# Patient Record
Sex: Female | Born: 1975 | Race: Black or African American | Hispanic: No | Marital: Married | Smoking: Never smoker
Health system: Southern US, Community
[De-identification: ages and names within clinical notes are randomized; demographics above are authoritative.]

---

## 2000-02-07 ENCOUNTER — Emergency Department (HOSPITAL_COMMUNITY): Admission: EM | Admit: 2000-02-07 | Discharge: 2000-02-07 | Payer: Self-pay | Admitting: Emergency Medicine

## 2000-04-13 ENCOUNTER — Other Ambulatory Visit: Admission: RE | Admit: 2000-04-13 | Discharge: 2000-04-13 | Payer: Self-pay | Admitting: Gynecology

## 2000-07-29 ENCOUNTER — Encounter (INDEPENDENT_AMBULATORY_CARE_PROVIDER_SITE_OTHER): Payer: Self-pay | Admitting: *Deleted

## 2000-07-29 LAB — CONVERTED CEMR LAB

## 2000-08-19 ENCOUNTER — Encounter: Admission: RE | Admit: 2000-08-19 | Discharge: 2000-08-19 | Payer: Self-pay | Admitting: Family Medicine

## 2000-08-25 ENCOUNTER — Encounter: Admission: RE | Admit: 2000-08-25 | Discharge: 2000-08-25 | Payer: Self-pay | Admitting: Family Medicine

## 2000-09-03 ENCOUNTER — Ambulatory Visit (HOSPITAL_COMMUNITY): Admission: RE | Admit: 2000-09-03 | Discharge: 2000-09-03 | Payer: Self-pay | Admitting: Family Medicine

## 2000-09-14 ENCOUNTER — Encounter: Admission: RE | Admit: 2000-09-14 | Discharge: 2000-09-14 | Payer: Self-pay | Admitting: Family Medicine

## 2000-10-19 ENCOUNTER — Encounter: Admission: RE | Admit: 2000-10-19 | Discharge: 2000-10-19 | Payer: Self-pay | Admitting: Family Medicine

## 2000-11-04 ENCOUNTER — Encounter: Admission: RE | Admit: 2000-11-04 | Discharge: 2000-11-04 | Payer: Self-pay | Admitting: Family Medicine

## 2000-11-09 ENCOUNTER — Encounter: Admission: RE | Admit: 2000-11-09 | Discharge: 2000-11-09 | Payer: Self-pay | Admitting: Family Medicine

## 2000-11-12 ENCOUNTER — Inpatient Hospital Stay (HOSPITAL_COMMUNITY): Admission: RE | Admit: 2000-11-12 | Discharge: 2000-11-24 | Payer: Self-pay | Admitting: *Deleted

## 2000-11-23 ENCOUNTER — Encounter: Payer: Self-pay | Admitting: *Deleted

## 2000-12-01 ENCOUNTER — Encounter (HOSPITAL_COMMUNITY): Admission: AD | Admit: 2000-12-01 | Discharge: 2000-12-31 | Payer: Self-pay | Admitting: *Deleted

## 2000-12-17 ENCOUNTER — Encounter: Admission: RE | Admit: 2000-12-17 | Discharge: 2000-12-17 | Payer: Self-pay | Admitting: Obstetrics & Gynecology

## 2000-12-24 ENCOUNTER — Encounter: Admission: RE | Admit: 2000-12-24 | Discharge: 2000-12-24 | Payer: Self-pay | Admitting: Obstetrics

## 2001-01-07 ENCOUNTER — Encounter: Admission: RE | Admit: 2001-01-07 | Discharge: 2001-01-07 | Payer: Self-pay | Admitting: Obstetrics

## 2001-01-08 ENCOUNTER — Ambulatory Visit (HOSPITAL_COMMUNITY): Admission: RE | Admit: 2001-01-08 | Discharge: 2001-01-08 | Payer: Self-pay | Admitting: Obstetrics & Gynecology

## 2001-01-14 ENCOUNTER — Encounter: Admission: RE | Admit: 2001-01-14 | Discharge: 2001-01-14 | Payer: Self-pay | Admitting: Obstetrics

## 2001-01-15 ENCOUNTER — Inpatient Hospital Stay (HOSPITAL_COMMUNITY): Admission: AD | Admit: 2001-01-15 | Discharge: 2001-01-15 | Payer: Self-pay | Admitting: Obstetrics & Gynecology

## 2001-01-18 ENCOUNTER — Encounter (HOSPITAL_COMMUNITY): Admission: AD | Admit: 2001-01-18 | Discharge: 2001-02-17 | Payer: Self-pay | Admitting: *Deleted

## 2001-01-21 ENCOUNTER — Encounter: Admission: RE | Admit: 2001-01-21 | Discharge: 2001-01-21 | Payer: Self-pay | Admitting: Obstetrics

## 2001-01-28 ENCOUNTER — Encounter: Admission: RE | Admit: 2001-01-28 | Discharge: 2001-01-28 | Payer: Self-pay | Admitting: Obstetrics

## 2001-02-01 ENCOUNTER — Observation Stay (HOSPITAL_COMMUNITY): Admission: AD | Admit: 2001-02-01 | Discharge: 2001-02-02 | Payer: Self-pay | Admitting: Obstetrics

## 2001-02-03 ENCOUNTER — Inpatient Hospital Stay (HOSPITAL_COMMUNITY): Admission: AD | Admit: 2001-02-03 | Discharge: 2001-02-03 | Payer: Self-pay | Admitting: Obstetrics

## 2001-02-04 ENCOUNTER — Encounter: Admission: RE | Admit: 2001-02-04 | Discharge: 2001-02-04 | Payer: Self-pay | Admitting: Obstetrics

## 2001-02-06 ENCOUNTER — Inpatient Hospital Stay (HOSPITAL_COMMUNITY): Admission: AD | Admit: 2001-02-06 | Discharge: 2001-02-06 | Payer: Self-pay | Admitting: Obstetrics

## 2001-02-16 ENCOUNTER — Inpatient Hospital Stay (HOSPITAL_COMMUNITY): Admission: AD | Admit: 2001-02-16 | Discharge: 2001-02-19 | Payer: Self-pay | Admitting: Obstetrics

## 2001-02-17 ENCOUNTER — Encounter: Payer: Self-pay | Admitting: Obstetrics

## 2001-02-24 ENCOUNTER — Inpatient Hospital Stay (HOSPITAL_COMMUNITY): Admission: AD | Admit: 2001-02-24 | Discharge: 2001-02-24 | Payer: Self-pay | Admitting: Obstetrics

## 2001-02-24 ENCOUNTER — Encounter: Payer: Self-pay | Admitting: Obstetrics

## 2001-02-24 ENCOUNTER — Encounter: Admission: RE | Admit: 2001-02-24 | Discharge: 2001-02-24 | Payer: Self-pay | Admitting: Obstetrics & Gynecology

## 2001-03-01 ENCOUNTER — Encounter (HOSPITAL_COMMUNITY): Admission: RE | Admit: 2001-03-01 | Discharge: 2001-03-07 | Payer: Self-pay | Admitting: Obstetrics

## 2001-03-06 ENCOUNTER — Inpatient Hospital Stay (HOSPITAL_COMMUNITY): Admission: AD | Admit: 2001-03-06 | Discharge: 2001-03-10 | Payer: Self-pay | Admitting: *Deleted

## 2001-03-13 ENCOUNTER — Inpatient Hospital Stay (HOSPITAL_COMMUNITY): Admission: AD | Admit: 2001-03-13 | Discharge: 2001-03-13 | Payer: Self-pay | Admitting: Obstetrics & Gynecology

## 2001-05-13 ENCOUNTER — Encounter: Admission: RE | Admit: 2001-05-13 | Discharge: 2001-05-13 | Payer: Self-pay | Admitting: Family Medicine

## 2001-06-04 ENCOUNTER — Encounter: Admission: RE | Admit: 2001-06-04 | Discharge: 2001-06-04 | Payer: Self-pay | Admitting: Family Medicine

## 2001-06-11 ENCOUNTER — Encounter: Admission: RE | Admit: 2001-06-11 | Discharge: 2001-06-11 | Payer: Self-pay | Admitting: Family Medicine

## 2001-09-06 ENCOUNTER — Encounter: Admission: RE | Admit: 2001-09-06 | Discharge: 2001-09-06 | Payer: Self-pay | Admitting: Family Medicine

## 2002-07-27 ENCOUNTER — Encounter: Admission: RE | Admit: 2002-07-27 | Discharge: 2002-07-27 | Payer: Self-pay | Admitting: Family Medicine

## 2002-08-15 ENCOUNTER — Encounter: Admission: RE | Admit: 2002-08-15 | Discharge: 2002-08-15 | Payer: Self-pay | Admitting: Family Medicine

## 2002-09-02 ENCOUNTER — Encounter: Admission: RE | Admit: 2002-09-02 | Discharge: 2002-09-02 | Payer: Self-pay | Admitting: Family Medicine

## 2003-03-08 ENCOUNTER — Encounter: Admission: RE | Admit: 2003-03-08 | Discharge: 2003-03-08 | Payer: Self-pay | Admitting: Family Medicine

## 2003-07-17 ENCOUNTER — Emergency Department (HOSPITAL_COMMUNITY): Admission: EM | Admit: 2003-07-17 | Discharge: 2003-07-17 | Payer: Self-pay | Admitting: Emergency Medicine

## 2006-05-29 ENCOUNTER — Encounter (INDEPENDENT_AMBULATORY_CARE_PROVIDER_SITE_OTHER): Payer: Self-pay | Admitting: *Deleted

## 2016-01-03 ENCOUNTER — Emergency Department (HOSPITAL_COMMUNITY)
Admission: EM | Admit: 2016-01-03 | Discharge: 2016-01-03 | Disposition: A | Discharge status: Home / Self Care | Payer: Self-pay | Attending: Emergency Medicine | Admitting: Emergency Medicine

## 2016-01-03 ENCOUNTER — Encounter (HOSPITAL_COMMUNITY): Payer: Self-pay | Admitting: Emergency Medicine

## 2016-01-03 DIAGNOSIS — K047 Periapical abscess without sinus: Secondary | ICD-10-CM | POA: Insufficient documentation

## 2016-01-03 MED ORDER — HYDROCODONE-ACETAMINOPHEN 5-325 MG PO TABS
1.0000 | ORAL_TABLET | Freq: Once | ORAL | Status: AC
  Administered 2016-01-03: 1 via ORAL
  Filled 2016-01-03: qty 1

## 2016-01-03 MED ORDER — CHLORHEXIDINE GLUCONATE 0.12 % MT SOLN: 15.0000 mL | Freq: Two times a day (BID) | OROMUCOSAL | 0 refills | Status: AC

## 2016-01-03 MED ORDER — PENICILLIN V POTASSIUM 500 MG PO TABS: 500.0000 mg | ORAL_TABLET | Freq: Three times a day (TID) | ORAL | 0 refills | Status: AC

## 2016-01-03 MED ORDER — PENICILLIN V POTASSIUM 250 MG PO TABS
500.0000 mg | ORAL_TABLET | Freq: Once | ORAL | Status: AC
  Administered 2016-01-03: 500 mg via ORAL
  Filled 2016-01-03: qty 2

## 2016-01-03 MED ORDER — HYDROCODONE-ACETAMINOPHEN 5-325 MG PO TABS: 1.0000 | ORAL_TABLET | Freq: Four times a day (QID) | ORAL | 0 refills | Status: DC | PRN

## 2016-01-03 MED ORDER — LIDOCAINE VISCOUS 2 % MT SOLN
15.0000 mL | Freq: Once | OROMUCOSAL | Status: AC
  Administered 2016-01-03: 15 mL via OROMUCOSAL
  Filled 2016-01-03: qty 15

## 2016-01-03 NOTE — ED Triage Notes (Signed)
Patient with dental pain on the lower left jaw.  She states she has been taking ibuprofen for pain with no relief.

## 2016-01-03 NOTE — ED Provider Notes (Signed)
[facility]-EMERGENCY DEPT Provider Note   CSN: [id number] Arrival date & time: 01/03/16  0308     History   Chief Complaint Chief Complaint  Patient presents with  . Dental Pain    HPI Julia [name] is a 40 y.o. female.   Dental Pain    Patient presents with concern of mouth pain. The pain is along the anterior surface, just inferior to her central incisors. Pain began a few days ago, became worse with the past 24 hours. The pain is burning, sharp, nonradiating. No difficulty swallowing, speaking, breathing. She is notable history of prior dental bridge, with known open anterior surface of the roots of each of her 2 front teeth. No relief with OTC medication.   History reviewed. No pertinent past medical history.  There are no active problems to display for this patient.   History reviewed. No pertinent surgical history.  OB History    Gravida Para Term Preterm AB Living   1             SAB TAB Ectopic Multiple Live Births                   Home Medications    Prior to Admission medications   Not on File    Family History No family history on file.  Social History Social History  Substance Use Topics  . Smoking status: Never Smoker  . Smokeless tobacco: Never Used  . Alcohol use Not on file     Allergies   Review of patient's allergies indicates no known allergies.   Review of Systems Review of Systems  Constitutional: Negative for fever.  HENT:       History of present illness  Respiratory: Negative for shortness of breath.   Cardiovascular: Negative for chest pain.  Gastrointestinal: Negative for nausea and vomiting.  Musculoskeletal:       Negative aside from HPI  Skin:       Negative aside from HPI  Allergic/Immunologic: Negative for immunocompromised state.  Neurological: Negative for weakness.     Physical Exam Updated Vital Signs BP (!) 159/117 (BP Location: Left Arm)   Pulse 86   Temp 98.1 F (36.7 C) (Oral)   Resp 18    Ht 5\' 5"  (1.651 m)   Wt 245 lb (111.1 kg)   LMP 12/03/2015 (Approximate)   SpO2 100%   BMI 40.77 kg/m   Physical Exam  Constitutional: She is oriented to person, place, and time. She appears well-developed and well-nourished. No distress.  HENT:  Head: Normocephalic and atraumatic.  Mouth/Throat: Uvula is midline and oropharynx is clear and moist.    Eyes: Conjunctivae and EOM are normal.  Cardiovascular: Normal rate and regular rhythm.   Pulmonary/Chest: Effort normal and breath sounds normal. No stridor. No respiratory distress.  Abdominal: She exhibits no distension.  Musculoskeletal: She exhibits no edema.  Neurological: She is alert and oriented to person, place, and time. No cranial nerve deficit.  Skin: Skin is warm and dry.  Psychiatric: She has a normal mood and affect.  Nursing note and vitals reviewed.    ED Treatments / Results   Procedures Procedures (including critical care time)  Medications Ordered in ED Medications  lidocaine (XYLOCAINE) 2 % viscous mouth solution 15 mL (15 mLs Mouth/Throat Given 01/03/16 0524)  HYDROcodone-acetaminophen (NORCO/VICODIN) 5-325 MG per tablet 1 tablet (1 tablet Oral Given 01/03/16 0523)  penicillin v potassium (VEETID) tablet 500 mg (500 mg Oral Given 01/03/16 0523)  Initial Impression / Assessment and Plan / ED Course  I have reviewed the triage vital signs and the nursing notes.  Pertinent labs & imaging results that were available during my care of the patient were reviewed by me and considered in my medical decision making (see chart for details).  Clinical Course    After the initial evaluation the patient had analgesia, Vicodin, viscous lidocaine, penicillin provided. Line on repeat exam the patient notes that she has minimal improvement, but also no new complaints. We discussed the importance of following up with dental providers for next evaluation of her exposed dentition, as well as dental infection.   Final  Clinical Impressions(s) / ED Diagnoses  Patient presents with open dental area, early infection, but no evidence for bacteremia, sepsis, compromised respiratory status, nor deep space infection. Patient provided antibiotics, analgesia, referral to dental services.   Gerhard Munchobert Advait Buice, MD 01/03/16 [phone]

## 2016-01-03 NOTE — Discharge Instructions (Signed)
As discussed, it is very important that you monitor your condition carefully, and be sure to follow-up with our dentist for further evaluation and management.

## 2017-07-08 ENCOUNTER — Emergency Department (HOSPITAL_COMMUNITY)
Admission: EM | Admit: 2017-07-08 | Discharge: 2017-07-08 | Disposition: A | Discharge status: Home / Self Care | Payer: No Typology Code available for payment source | Attending: Emergency Medicine | Admitting: Emergency Medicine

## 2017-07-08 ENCOUNTER — Encounter (HOSPITAL_COMMUNITY): Payer: Self-pay

## 2017-07-08 ENCOUNTER — Emergency Department (HOSPITAL_COMMUNITY): Payer: No Typology Code available for payment source

## 2017-07-08 DIAGNOSIS — R030 Elevated blood-pressure reading, without diagnosis of hypertension: Secondary | ICD-10-CM

## 2017-07-08 DIAGNOSIS — E049 Nontoxic goiter, unspecified: Secondary | ICD-10-CM

## 2017-07-08 DIAGNOSIS — R51 Headache: Secondary | ICD-10-CM | POA: Diagnosis present

## 2017-07-08 DIAGNOSIS — D509 Iron deficiency anemia, unspecified: Secondary | ICD-10-CM

## 2017-07-08 DIAGNOSIS — R590 Localized enlarged lymph nodes: Secondary | ICD-10-CM

## 2017-07-08 DIAGNOSIS — R519 Headache, unspecified: Secondary | ICD-10-CM

## 2017-07-08 LAB — CBC WITH DIFFERENTIAL/PLATELET
BASOS ABS: 0.1 10*3/uL (ref 0.0–0.1)
BASOS PCT: 1 %
EOS ABS: 0.2 10*3/uL (ref 0.0–0.7)
EOS PCT: 4 %
HEMATOCRIT: 35.3 % — AB (ref 36.0–46.0)
HEMOGLOBIN: 11 g/dL — AB (ref 12.0–15.0)
LYMPHS ABS: 2 10*3/uL (ref 0.7–4.0)
LYMPHS PCT: 41 %
MCH: 21.9 pg — ABNORMAL LOW (ref 26.0–34.0)
MCHC: 31.2 g/dL (ref 30.0–36.0)
MCV: 70.2 fL — ABNORMAL LOW (ref 78.0–100.0)
MONO ABS: 0.3 10*3/uL (ref 0.1–1.0)
Monocytes Relative: 6 %
Neutro Abs: 2.4 10*3/uL (ref 1.7–7.7)
Neutrophils Relative %: 48 %
Platelets: 389 10*3/uL (ref 150–400)
RBC: 5.03 MIL/uL (ref 3.87–5.11)
RDW: 18.2 % — ABNORMAL HIGH (ref 11.5–15.5)
WBC: 4.9 10*3/uL (ref 4.0–10.5)

## 2017-07-08 LAB — BASIC METABOLIC PANEL
Anion gap: 9 (ref 5–15)
BUN: 8 mg/dL (ref 6–20)
CHLORIDE: 103 mmol/L (ref 101–111)
CO2: 25 mmol/L (ref 22–32)
Calcium: 8.5 mg/dL — ABNORMAL LOW (ref 8.9–10.3)
Creatinine, Ser: 0.53 mg/dL (ref 0.44–1.00)
GFR calc Af Amer: >60 mL/min (ref >60)
GFR calc non Af Amer: >60 mL/min (ref >60)
GLUCOSE: 102 mg/dL — AB (ref 65–99)
POTASSIUM: 3.4 mmol/L — AB (ref 3.5–5.1)
Sodium: 137 mmol/L (ref 135–145)

## 2017-07-08 LAB — POC URINE PREG, ED: Preg Test, Ur: NEGATIVE

## 2017-07-08 MED ORDER — SODIUM CHLORIDE 0.9 % IV BOLUS
1000.0000 mL | Freq: Once | INTRAVENOUS | Status: AC
  Administered 2017-07-08: 1000 mL via INTRAVENOUS

## 2017-07-08 MED ORDER — KETOROLAC TROMETHAMINE 30 MG/ML IJ SOLN
30.0000 mg | Freq: Once | INTRAMUSCULAR | Status: AC
  Administered 2017-07-08: 30 mg via INTRAVENOUS
  Filled 2017-07-08: qty 1

## 2017-07-08 MED ORDER — IOPAMIDOL (ISOVUE-370) INJECTION 76%
INTRAVENOUS | Status: AC
  Filled 2017-07-08: qty 100

## 2017-07-08 MED ORDER — IOPAMIDOL (ISOVUE-370) INJECTION 76%
100.0000 mL | Freq: Once | INTRAVENOUS | Status: AC | PRN
  Administered 2017-07-08: 100 mL via INTRAVENOUS

## 2017-07-08 MED ORDER — ACETAMINOPHEN 500 MG PO TABS
1000.0000 mg | ORAL_TABLET | Freq: Once | ORAL | Status: AC
  Administered 2017-07-08: 1000 mg via ORAL
  Filled 2017-07-08: qty 2

## 2017-07-08 MED ORDER — SODIUM CHLORIDE 0.9 % IV BOLUS
500.0000 mL | Freq: Once | INTRAVENOUS | Status: AC
  Administered 2017-07-08: 500 mL via INTRAVENOUS

## 2017-07-08 MED ORDER — SUMATRIPTAN SUCCINATE 50 MG PO TABS: 50.0000 mg | ORAL_TABLET | ORAL | 0 refills | Status: DC | PRN

## 2017-07-08 MED ORDER — METOCLOPRAMIDE HCL 5 MG/ML IJ SOLN
10.0000 mg | Freq: Once | INTRAMUSCULAR | Status: AC
  Administered 2017-07-08: 10 mg via INTRAVENOUS
  Filled 2017-07-08: qty 2

## 2017-07-08 NOTE — ED Notes (Signed)
Pt complains of a severe headache and high blood pressure for two days, no history of either problem

## 2017-07-08 NOTE — ED Notes (Signed)
Attempted to start IV x 2 unsuccessfully

## 2017-07-08 NOTE — ED Notes (Signed)
Patient transported to MRI 

## 2017-07-08 NOTE — ED Provider Notes (Signed)
[facility]-EMERGENCY DEPT Provider Note   CSN: [id number] Arrival date & time: 07/08/17  0259     History   Chief Complaint Chief Complaint  Patient presents with  . Headache    HPI Julia [name] is a 42 y.o. female.   42 year old female presents to the emergency department for evaluation of a headache which began yesterday morning.  It has been constant and waxing and waning in severity.  She notes the pain primarily above her left eye.  She has been taking Tylenol for symptoms without relief.  She does note a history of similar headaches, but reports they have never been "this bad".  No associated fevers or recent head injury or trauma.  She further denies vision loss, nausea, vomiting, extremity numbness or weakness.  Patient noticed that her blood pressure was higher than baseline prior to arrival.  No history of hypertension.     History reviewed. No pertinent past medical history.  There are no active problems to display for this patient.   History reviewed. No pertinent surgical history.   OB History    Gravida  1   Para      Term      Preterm      AB      Living        SAB      TAB      Ectopic      Multiple      Live Births               Home Medications    Prior to Admission medications   Medication Sig Start Date End Date Taking? Authorizing Provider  acetaminophen (TYLENOL) 500 MG tablet Take 1,000 mg by mouth every 6 (six) hours as needed for moderate pain or headache.   Yes [provider]  HYDROcodone-acetaminophen (NORCO/VICODIN) 5-325 MG tablet Take 1 tablet by mouth every 6 (six) hours as needed for severe pain. Patient not taking: Reported on 07/08/2017 01/03/16   Gerhard MunchLockwood, Robert, MD    Family History History reviewed. No pertinent family history.  Social History Social History   Tobacco Use  . Smoking status: Never Smoker  . Smokeless tobacco: Never Used  Substance Use Topics  .  Alcohol use: Never    Frequency: Never  . Drug use: Never     Allergies   Patient has no known allergies.   Review of Systems Review of Systems Ten systems reviewed and are negative for acute change, except as noted in the HPI.    Physical Exam Updated Vital Signs BP 121/83 (BP Location: Left Arm)   Pulse 74   Temp 98 F (36.7 C) (Oral)   Resp 20   LMP 07/05/2017   SpO2 100%   Breastfeeding? Unknown   Physical Exam  Constitutional: She is oriented to person, place, and time. She appears well-developed and well-nourished. No distress.  Nontoxic appearing and in NAD  HENT:  Head: Normocephalic and atraumatic.  Eyes: Conjunctivae and EOM are normal. No scleral icterus.  Neck: Normal range of motion.  No nuchal rigidity or meningismus  Pulmonary/Chest: Effort normal. No respiratory distress.  Respirations even and unlabored  Musculoskeletal: Normal range of motion.  Neurological: She is alert and oriented to person, place, and time. No cranial nerve deficit. She exhibits normal muscle tone. Coordination normal.  GCS 15. Speech is goal oriented. No cranial nerve deficits appreciated; symmetric eyebrow raise, no facial drooping, tongue midline. Sensation to light touch intact.  Patient moves extremities without ataxia. No focal deficits noted on exam.  Skin: Skin is warm and dry. No rash noted. She is not diaphoretic. No erythema. No pallor.  Psychiatric: She has a normal mood and affect. Her behavior is normal.  Nursing note and vitals reviewed.    ED Treatments / Results  Labs (all labs ordered are listed, but only abnormal results are displayed) Labs Reviewed - No data to display  EKG None  Radiology No results found.  Procedures Procedures (including critical care time)  Medications Ordered in ED Medications  ketorolac (TORADOL) 30 MG/ML injection 30 mg (has no administration in time range)  metoCLOPramide (REGLAN) injection 10 mg (has no administration in  time range)  sodium chloride 0.9 % bolus 500 mL (has no administration in time range)     Initial Impression / Assessment and Plan / ED Course  I have reviewed the triage vital signs and the nursing notes.  Pertinent labs & imaging results that were available during my care of the patient were reviewed by me and considered in my medical decision making (see chart for details).     Patient presents to the emergency department for evaluation of headache which began yesterday AM.  Patient with no history of recent head injury or trauma.  No fever, nuchal rigidity, meningismus to suggest meningitis.  Neurologic exam today is nonfocal.  No concerning findings to suggest [facility] or other emergent intracranial process.  Plan for headache management with Toradol, Reglan, fluids.  Patient signed out to Aviva Kluver, PA-C at change of shift will assess patient and disposition appropriately.  Vitals:   07/08/17 0332 07/08/17 0431  BP: (!) 147/115 121/83  Pulse: 74   Resp: 20   Temp: 98 F (36.7 C)   TempSrc: Oral   SpO2: 100%     Final Clinical Impressions(s) / ED Diagnoses   Final diagnoses:  Bad headache    ED Discharge Orders    None       Antony Madura, PA-C 07/08/17 0631    Molpus, Jonny Ruiz, MD 07/08/17 [phone]

## 2017-07-08 NOTE — ED Provider Notes (Signed)
Physical Exam  BP 129/75 (BP Location: Left Arm)   Pulse 70   Temp 98 F (36.7 C) (Oral)   Resp 18   LMP 07/05/2017   SpO2 98%   Breastfeeding? Unknown   [facility]. Briefly, the patient is a 42 y.o. female with PMHx of  has no past medical history on file. here with left-sided headache.  Patient reports that her headaches are usually frontal in nature, and less severe.  Patient reports that this current episode is "throbbing".  Patient denies neck stiffness or pain, fevers, chills, nausea, vomiting, or recent illness.  Patient denies visual disturbance with her headache.  Patient reports that for half hour.  Yesterday, she had her left hand weakness with grip strength, that resolved spontaneously.  No focal deficit at present.  No rashes.  Patient reports that sitting up makes her headache worse, and lying flat resolves it. Not relieved by OTC analgesia.  No family history of aneurysms, subarachnoid bleeding, stroke.  Patient denies estrogen use.  Labs Reviewed  CBC WITH DIFFERENTIAL/PLATELET - Abnormal; Notable for the following components:      Result Value   Hemoglobin 11.0 (*)    HCT 35.3 (*)    MCV 70.2 (*)    MCH 21.9 (*)    RDW 18.2 (*)    All other components within normal limits  BASIC METABOLIC PANEL  POC URINE PREG, ED    Course of Care:   Physical Exam  Constitutional: She appears well-developed and well-nourished. No distress.  HENT:  Head: Normocephalic and atraumatic.  Mouth/Throat: Oropharynx is clear and moist.  Eyes: Pupils are equal, round, and reactive to light. Conjunctivae and EOM are normal.  Neck: Normal range of motion. Neck supple. No neck rigidity. No Brudzinski's sign noted.  Cardiovascular: Normal rate, regular rhythm, S1 normal and S2 normal.  No murmur heard. Pulmonary/Chest: Effort normal and breath sounds normal. She has no wheezes. She has no rales.  Abdominal: Soft. She exhibits no distension. There is no tenderness.  There is no guarding.  Musculoskeletal: Normal range of motion. She exhibits no edema or deformity.  Lymphadenopathy:    She has no cervical adenopathy.  Neurological: She is alert. GCS eye subscore is 4. GCS verbal subscore is 5. GCS motor subscore is 6.  Mental Status:  Alert, oriented, thought content appropriate, able to give a coherent history. Speech fluent without evidence of aphasia. Able to follow 2 step commands without difficulty.  Cranial Nerves:  II:  Peripheral visual fields grossly normal, pupils equal, round, reactive to light III,IV, VI: ptosis not present, extra-ocular motions intact bilaterally  V,VII: smile symmetric, facial light touch sensation equal VIII: hearing grossly normal to voice  X: uvula elevates symmetrically  XI: bilateral shoulder shrug symmetric and strong XII: midline tongue extension without fassiculations Motor:  Normal tone. 5/5 in upper and lower extremities bilaterally including strong and equal grip strength and dorsiflexion/plantar flexion Sensory: Light touch normal in all extremities and equal.  Deep Tendon Reflexes: 2+ and symmetric in the biceps and patella. No clonus. Cerebellar: normal finger-to-nose with bilateral upper extremities Gait: normal gait and balance Stance: No pronator drift and good coordination, strength, and position sense with tapping of bilateral arms (performed in sitting position). CV pulses palpable throughout.   Skin: Skin is warm and dry. No rash noted. No erythema.  Psychiatric: She has a normal mood and affect. Her behavior is normal. Judgment and thought content normal.  Nursing note and vitals  reviewed.   ED Course/Procedures   Clinical Course as of Jul 08 1349  Wed Jul 08, 2017  1319 Patient reassessed. Patient continuing to have positional headache.    [AM]  1344 Patient tolerating PO and does have improved headache.    [AM]    Clinical Course User Index [AM] Elisha Ponder, PA-C     Procedures  MDM  Patient well-appearing in no acute distress.  Patient asymptomatic when lying down, but reports that her headache begins to return when she ambulates.  Differential diagnosis includes TIA, complicated migraine, venous thrombosis.  Doubt subarachnoid hemorrhage, due to the gradual onset of the pain.  Doubt meningitis, as patient is afebrile, has normal mental status and normal neurologic exam, and is without nuchal rigidity or meningismal signs.  Due to the transient neurologic symptoms patient reported of left hand weakness, will obtain MRI-MRA to assess for vascular anomalies and infarction.  Lab work without significant abnormality.  Patient was unable to tolerate the modifications required to be in MRI machine.  Patient also declined antianxiety medications.  Proceed to CT head noncontrast as well as CTA head and neck.  CT angiogram of the head and neck demonstrates no acute vascular findings.  CT scan of the head and neck demonstrates a nonspecific goiter as well as nonspecific cervical adenopathy.  Patient to follow-up with ear nose and throat.,   Patient is ambulatory, tolerating p.o., and has improved headache upon discharge.  Patient independently evaluated by Dr. Chaney Malling who agrees with no further workup at this time.  Given the normal parenchyma of the CT, doubt sinus venous thrombosis.      Elisha Ponder, PA-C 07/08/17 1400    Charlynne Pander, MD 07/08/17 [phone]

## 2017-07-08 NOTE — ED Notes (Signed)
Spoke with MRI. MRI will be here to get patient in about 45 min. Verified with patient that she is able to lay flat and is not claustrophobic.

## 2017-07-08 NOTE — Discharge Instructions (Addendum)
Please see the information and instructions below regarding your visit.  Your diagnoses today include:  1. Bad headache   2. Left-sided headache   3. Microcytic anemia   4. Elevated blood pressure reading without diagnosis of hypertension   5. Cervical adenopathy   6. Goiter     You were seen and treated in the emergency department today for headache. Fortunately, your vitals, exam, and work-up is reassuring with no apparent emergent cause for your headache at this time.  Tests performed today include: See side panel of your discharge paperwork for testing performed today. Vital signs are listed at the bottom of these instructions.   Please follow the prescribed instructions for Imitrex.  Please only take once a week.  Your limited to 200 mg in 1 day for this medication.  Medications prescribed:    Try to avoid daily or regular use of tylenol, aspirin, ibuprofen, and other overt-the-counter pain medications as this can contribute to rebound headaches.   Take any prescribed medications only as prescribed, and any over the counter medications only as directed on the packaging.  Home care instructions:   Drink plenty of fluids at home. This will help with your headache. Be cautious with caffeine use, as this can cause your headache to rebound when the effects wear off. If you drink more than 2 cups of coffee/caffeinated tea, or caffeinated soda per day, I suggest you wean down that amount.  Please follow any educational materials contained in this packet.   Follow-up instructions: Please follow-up with your primary care provider in  for further evaluation of your symptoms if they are not completely improved.   Please follow up ENT for the enlarged thyroid seen on your Ct.   Please follow-up with [facility] for your headaches.   Return instructions:  Please return to the Emergency Department if you experience worsening symptoms. It is VERY important that you  monitor your symptoms at home. If you develop worsening headache, new fever, new neck stiffness, rash, focal weakness or numbness, or any other new or concerning symptoms, please return to the ED immediately, as these may be signs that your headache has become a potentially serious and life-threatening condition.  Please return if you have any other emergent concerns.  Additional Information:   Your vital signs today were: BP 137/88 (BP Location: Left Arm)    Pulse 66    Temp 98 F (36.7 C) (Oral)    Resp 18    LMP 07/05/2017    SpO2 97%    Breastfeeding? Unknown Comment: negative urine pregnancy test 07-08-2017 If your blood pressure (BP) was elevated on multiple readings during this visit above 130 for the top number or above 80 for the bottom number, please have this repeated by your primary care provider within one month. --------------  Thank you for allowing us to participate in your care today.

## 2018-05-21 ENCOUNTER — Ambulatory Visit: Payer: Self-pay

## 2018-05-21 ENCOUNTER — Ambulatory Visit: Payer: No Typology Code available for payment source | Admitting: Sports Medicine

## 2018-05-21 ENCOUNTER — Encounter: Payer: Self-pay | Admitting: Sports Medicine

## 2018-05-21 VITALS — BP 110/76 | HR 89 | Ht 65.0 in | Wt 252.6 lb

## 2018-05-21 DIAGNOSIS — M25561 Pain in right knee: Secondary | ICD-10-CM

## 2018-05-21 DIAGNOSIS — M25562 Pain in left knee: Secondary | ICD-10-CM | POA: Diagnosis not present

## 2018-05-21 DIAGNOSIS — M25462 Effusion, left knee: Secondary | ICD-10-CM | POA: Diagnosis not present

## 2018-05-21 DIAGNOSIS — M170 Bilateral primary osteoarthritis of knee: Secondary | ICD-10-CM | POA: Diagnosis not present

## 2018-05-21 DIAGNOSIS — G8929 Other chronic pain: Secondary | ICD-10-CM

## 2018-05-21 NOTE — Progress Notes (Signed)
PROCEDURE NOTE:  Ultrasound Guided: Injection: Right knee Images were obtained and interpreted by myself, Gaspar Bidding, DO  Images have been saved and stored to PACS system. Images obtained on: GE S7 Ultrasound machine    ULTRASOUND FINDINGS:  No significant effusion, moderate synovitis  DESCRIPTION OF PROCEDURE:  The patient's clinical condition is marked by substantial pain and/or significant functional disability. Other conservative therapy has not provided relief, is contraindicated, or not appropriate. There is a reasonable likelihood that injection will significantly improve the patient's pain and/or functional impairment.   After discussing the risks, benefits and expected outcomes of the injection and all questions were reviewed and answered, the patient wished to undergo the above named procedure.  Verbal consent was obtained.  The ultrasound was used to identify the target structure and adjacent neurovascular structures. The skin was then prepped in sterile fashion and the target structure was injected under direct visualization using sterile technique as below:  Single injection performed as below: PREP: Alcohol and Ethel Chloride APPROACH:superiolateral, single injection, 25g 1.5 in. INJECTATE: 2 cc 0.5% Marcaine and 1 cc 40mg /mL DepoMedrol ASPIRATE: None DRESSING: Band-Aid  Post procedural instructions including recommending icing and warning signs for infection were reviewed.    This procedure was well tolerated and there were no complications.   IMPRESSION: Succesful Ultrasound Guided: Injection

## 2018-05-21 NOTE — Procedures (Signed)
PROCEDURE NOTE:  Ultrasound Guided: Aspiration and Injection: Left knee Images were obtained and interpreted by myself, Gaspar Bidding, DO  Images have been saved and stored to PACS system. Images obtained on: GE S7 Ultrasound machine    ULTRASOUND FINDINGS:  Moderate effusion  DESCRIPTION OF PROCEDURE:  The patient's clinical condition is marked by substantial pain and/or significant functional disability. Other conservative therapy has not provided relief, is contraindicated, or not appropriate. There is a reasonable likelihood that injection will significantly improve the patient's pain and/or functional impairment.   After discussing the risks, benefits and expected outcomes of the injection and all questions were reviewed and answered, the patient wished to undergo the above named procedure.  Verbal consent was obtained.  The ultrasound was used to identify the target structure and adjacent neurovascular structures. The skin was then prepped in sterile fashion and the target structure was injected under direct visualization using sterile technique as below:  Single injection performed as below: PREP: Alcohol, Ethel Chloride and 5 cc 1% lidocaine on 25g 1.5 in. needle APPROACH:superiolateral, stopcock technique, 18g 1.5 in. INJECTATE: 2 cc 0.5% Marcaine and 1 cc 40mg /mL DepoMedrol ASPIRATE: 20 cc of slightly orange-tinged synovial fluid DRESSING: Band-Aid  Post procedural instructions including recommending icing and warning signs for infection were reviewed.    This procedure was well tolerated and there were no complications.   IMPRESSION: Succesful Ultrasound Guided: Aspiration and Injection

## 2018-05-21 NOTE — Patient Instructions (Signed)

## 2018-05-21 NOTE — Progress Notes (Signed)
Julia [name]. Julia [name] Sports Medicine [facility] at [facility] [phone]  [name] - 43 y.o. female MRN [id number]  Date of birth: [birth year]  Visit Date: May 21, 2018  PCP: Patient, No Pcp Per   Referred by: No ref. provider found  SUBJECTIVE:   Chief Complaint  Patient presents with  . New Patient (Initial Visit)    B knee pain    HPI: Patient presents with worsening bilateral knee pain.  It has been progressive over the past year to the point that now she is having generalized tightness and severe pain across the entire left knee.  She has mild pain on the right.  She feels there is knee is tight.  It is worse when going from sit to stand.  Initially has difficulty with weightbearing.  Positive theater sign.  She is tried naproxen as needed and ibuprofen and this previously was helpful but no longer is.  REVIEW OF SYSTEMS: Denies fevers, chills, recent weight gain or weight loss.  No night sweats.  Pt denies any change in bowel or bladder habits, muscle weakness, numbness or falls associated with this pain. She does have some nighttime disturbances due to this Otherwise 12 point review of systems performed and is negative   HISTORY:  Prior history reviewed and updated per electronic medical record.  There are no active problems to display for this patient.  Social History   Occupational History  . Not on file  Tobacco Use  . Smoking status: Never Smoker  . Smokeless tobacco: Never Used  Substance and Sexual Activity  . Alcohol use: Never    Frequency: Never  . Drug use: Never  . Sexual activity: Not on file   Social History   Social History Narrative  . Not on file   No past medical history on file. No past surgical history on file. family history is not on file.  OBJECTIVE:  VS:  HT:5\' 5"  (165.1 cm)   WT:252 lb 9.6 oz (114.6 kg)  BMI:42.03    BP:110/76  HR:89bpm  TEMP: ( )  RESP:96 %   PHYSICAL  EXAM: CONSTITUTIONAL: Well-developed, Well-nourished and In no acute distress EYES: Pupils are equal., EOM intact without nystagmus. and No scleral icterus. Psychiatric: Alert & appropriately interactive. and Not depressed or anxious appearing. EXTREMITY EXAM: Warm and well perfused  Her bilateral knees are quite large.  She does have an effusion on the left and generalized tenderness diffusely on both knees.  Ligamentously she is stable to varus and valgus strain.  Negative McMurray's.  Slightly positive patellar grind on the left, mild on the right.  Extensor mechanism intact.  No significant lower extremity edema.   ASSESSMENT:   1. Chronic pain of both knees   2. Effusion of left knee   3. Primary osteoarthritis of both knees     PROCEDURES:  US Guided Injection per procedure note      PLAN:  Pertinent additional documentation may be included in corresponding procedure notes, imaging studies, problem based documentation and patient instructions.  No problem-specific Assessment & Plan notes found for this encounter.  We will go ahead and inject the knee per procedure note    Icing discussed PRN.  If persistent ongoing symptoms can consider repeat injections and viscous supplementation.  Activity modifications and the importance of avoiding exacerbating activities (limiting pain to no more than a 4 / 10 during or following activity) recommended and discussed.  Discussed red flag symptoms  that warrant earlier emergent evaluation and patient voices understanding.  No orders of the defined types were placed in this encounter.  Lab Orders  No laboratory test(s) ordered today   Imaging Orders     [place] MSK POCT ULTRASOUND Referral Orders  No referral(s) requested today    Return in about 6 weeks (around 07/02/2018) for bilateral knee pain.          Andrena Mews, DO    [facility] Physician

## 2018-06-24 ENCOUNTER — Ambulatory Visit (INDEPENDENT_AMBULATORY_CARE_PROVIDER_SITE_OTHER): Payer: No Typology Code available for payment source | Admitting: Sports Medicine

## 2018-06-24 ENCOUNTER — Encounter: Payer: Self-pay | Admitting: Sports Medicine

## 2018-06-24 ENCOUNTER — Ambulatory Visit (INDEPENDENT_AMBULATORY_CARE_PROVIDER_SITE_OTHER): Payer: No Typology Code available for payment source

## 2018-06-24 ENCOUNTER — Other Ambulatory Visit: Payer: Self-pay

## 2018-06-24 VITALS — HR 88 | Temp 98.3°F | Ht 65.0 in | Wt 242.2 lb

## 2018-06-24 DIAGNOSIS — M25562 Pain in left knee: Secondary | ICD-10-CM

## 2018-06-24 DIAGNOSIS — M25462 Effusion, left knee: Secondary | ICD-10-CM

## 2018-06-24 DIAGNOSIS — M25561 Pain in right knee: Secondary | ICD-10-CM | POA: Diagnosis not present

## 2018-06-24 DIAGNOSIS — M170 Bilateral primary osteoarthritis of knee: Secondary | ICD-10-CM

## 2018-06-24 DIAGNOSIS — G8929 Other chronic pain: Secondary | ICD-10-CM | POA: Diagnosis not present

## 2018-06-24 NOTE — Progress Notes (Signed)
Julia [name]. [name] Sports Medicine [facility] at [facility] [phone]  Julia [name] - 43 y.o. female MRN [id number]  Date of birth: 08-Jan-1976  Visit Date: June 27, 2018  PCP: Patient, No Pcp Per   Referred by: No ref. provider found  SUBJECTIVE:  Chief Complaint  Patient presents with  . Follow-up    B knee pain.  Worsened w/ sit to stand.  B knee injections - 05/21/18    HPI: Patient is here for follow-up of bilateral knee pain.  Left is worse than the right.  Continues to be mild.  Pain is worse with going from sit to stand as well as going up and down steps.  She has a positive theater sign.  She underwent bilateral injections on 2 1 with aspiration of the left knee.  She reports overall they seem to help some but only incomplete relief of pain.  She continues to take ibuprofen intermittently.  REVIEW OF SYSTEMS: She denies any significant nighttime disturbances.  Denies any fevers chills or night sweats.  Otherwise 12 point review of systems is performed and negative.  HISTORY:  Prior history reviewed and updated per electronic medical record.  There are no active problems to display for this patient.  Social History   Occupational History  . Not on file  Tobacco Use  . Smoking status: Never Smoker  . Smokeless tobacco: Never Used  Substance and Sexual Activity  . Alcohol use: Never    Frequency: Never  . Drug use: Never  . Sexual activity: Not on file   Social History   Social History Narrative  . Not on file     OBJECTIVE:  VS:  HT:5\' 5"  (165.1 cm)   WT:242 lb 3.2 oz (109.9 kg)  BMI:40.3    BP:   HR:88bpm  TEMP:98.3 F (36.8 C)( )  RESP:99 %   PHYSICAL EXAM: Adult female. No acute distress.  Alert and appropriate. Bilateral knees are well aligned.  She has improved effusion bilaterally.  Only small effusion today on the left.  Ligamentously she has 2 2 mm of opening with valgus stress bilaterally.  Pain along the  medial greater than lateral joint lines.  Bilateral knee x-rays obtained today that do show mild to moderate degenerative changes worse in the medial compartment bilaterally.  She does have some patellofemoral wear.   ASSESSMENT:  1. Chronic pain of both knees     PROCEDURES:  PROCEDURE NOTE: THERAPEUTIC EXERCISES (97110) 15 minutes spent for Therapeutic exercises as below and as referenced in the AVS.  This included exercises focusing on stretching, strengthening, with significant focus on eccentric aspects.   Proper technique shown and discussed handout in great detail with ATC.  All questions were discussed and answered.   Long term goals include an improvement in range of motion, strength, endurance as well as avoiding reinjury. Frequency of visits is one time as determined during today's  office visit. Frequency of exercises to be performed is as per handout.  EXERCISES REVIEWED: Hip ABduction strengthening with focus on Glute Medius Recruitment VMO Strengthening   PLAN:  Pertinent additional documentation may be included in corresponding procedure notes, imaging studies, problem based documentation and patient instructions.  No problem-specific Assessment & Plan notes found for this encounter.   She had incomplete relief with the last corticosteroid injection.Open have her try the topical diclofenac that she got has and get her preapproved for Zilretta injections bilaterally given the overall moderate but  incomplete relief of improvement.   Activity modifications and the importance of avoiding exacerbating activities (limiting pain to no more than a 4 / 10 during or following activity) recommended and discussed.   Discussed red flag symptoms that warrant earlier emergent evaluation and patient voices understanding.    No orders of the defined types were placed in this encounter.  Lab Orders  No laboratory test(s) ordered today    Imaging Orders     DG Knee 3 Views Left      DG Knee 1-2 Views Right Referral Orders  No referral(s) requested today    Return in about 4 weeks (around 07/22/2018), or if symptoms worsen or fail to improve.          Julia Mews, DO    [facility] Physician

## 2018-06-24 NOTE — Patient Instructions (Addendum)
Pennsaid instructions: You have been given a sample/prescription for Pennsaid, a topical medication.     You are to apply this gel to your injured body part twice daily (morning and evening).   A little goes a long way so you can use about a pea-sized amount for each area.   Spread this small amount over the area into a thin film and let it dry.   Be sure that you do not rub the gel into your skin for more than 10 or 15 seconds otherwise it can irritate you skin.    Once you apply the gel, please do not put any other lotion or clothing in contact with that area for 30 minutes to allow the gel to absorb into your skin.   Some people are sensitive to the medication and can develop a sunburn-like rash.  If you have only mild symptoms it is okay to continue to use the medication but if you have any breakdown of your skin you should discontinue its use and please let us know.   If you have been written a prescription for Pennsaid, you will receive a pump bottle of this topical gel through a mail order pharmacy.  The instructions on the bottle will say to apply two pumps twice a day which may be too much gel for your particular area so use the pea-sized amount as your guide.   Instructions for Duexis, Pennsaid and Vimovo:  Your prescription will be filled through a participating [facility] mail order pharmacy.  You will receive a phone call or text from one of the participating pharmacies which can be located in any state in the [place].  You must communicate directly with them to have this medication filled.  When the pharmacy contacts you, they will need your mailing address (for shipment of the medication) andy they will need payment information if you have a copay (typically no more than $10). If you have not heard from them 2-3 days after your appointment with Dr. Berline Chough, contact [facility] directly at [phone].   Please perform the exercise program that we have prepared for  you and gone over in detail on a daily basis.  In addition to the handout you were provided you can access your program through: [email or web address]   Your unique program code is:  [phone]

## 2018-07-20 ENCOUNTER — Ambulatory Visit: Payer: Self-pay

## 2018-07-20 ENCOUNTER — Other Ambulatory Visit: Payer: Self-pay

## 2018-07-20 ENCOUNTER — Encounter: Payer: Self-pay | Admitting: Sports Medicine

## 2018-07-20 ENCOUNTER — Ambulatory Visit: Payer: No Typology Code available for payment source | Admitting: Sports Medicine

## 2018-07-20 VITALS — BP 130/76 | HR 83 | Temp 98.3°F | Ht 65.0 in | Wt 245.4 lb

## 2018-07-20 DIAGNOSIS — M170 Bilateral primary osteoarthritis of knee: Secondary | ICD-10-CM | POA: Diagnosis not present

## 2018-07-20 NOTE — Procedures (Signed)
  Veverly Fells. [name] Sports Medicine [facility] at [facility] [phone]  MAANSI UNZUETA - 43 y.o. female MRN [id number]  Date of birth: 1976-01-24  Visit Date: 07/20/2018  PCP: Patient, No Pcp Per   Referred by: No ref. provider found  Office VISIT  SUBJECTIVE:  DIMITRA MALAFRONTE is here for Chief Complaint  Patient presents with  . Follow-up    B knee pain.  Wants Zilretta injections today.  Pennsaid.  HEP given previously.  B knee injections - 05/21/18    Pt reports no significant changes in their general health or in their day to day symptoms for the condition(s) that are being treated today.  She continues to have bilateral knee pain left worse than right.  She responded well but had short-term intermittent relief with the Depo-Medrol injections.  She is interested in [place] today.  They deny any fevers, chills, or night sweats.   OBJECTIVE:  PHYSICAL EXAM: Please see previous exam notes for full joint and extremities No overlying skin changes appreciated.  MSK Exam: BP 130/76 (BP Location: Left Arm, Patient Position: Sitting, Cuff Size: Large)   Pulse 83   Temp 98.3 F (36.8 C)   Ht 5\' 5"  (1.651 m)   Wt 245 lb 6.4 oz (111.3 kg)   SpO2 99%   BMI 40.84 kg/m   Generalized osteoarthritic bossing.  No significant extensor mechanism is intact.  Ligamentously stable.  ASSESSMENT   1. Primary osteoarthritis of both knees     PLAN:   Procedures:  PROCEDURE NOTE:  Ultrasound Guided: Zilretta Injection: Bilateral knee   DESCRIPTION OF PROCEDURE:  The patient's clinical condition is marked by substantial pain and/or significant functional disability. Other conservative therapy has not provided relief, is contraindicated, or not appropriate. There is a reasonable likelihood that injection will significantly improve the patient's pain and/or functional impairment.   After discussing the risks, benefits and expected outcomes of the injection  and all questions were reviewed and answered, the patient wished to undergo the above named procedure.  Verbal consent was obtained.  The ultrasound was used to identify the target structure and adjacent neurovascular structures. The skin was then prepped in sterile fashion and the target structure was injected under direct visualization using sterile technique as below:  Bilateral injections performed under identical technique as below: PREP: Alcohol and Ethel Chloride APPROACH:superiolateral, single injection, 21g 2 in. INJECTATE: 5cc Zilretta (32mg /72mL Sustained Release Triamcinolone) ASPIRATE: None DRESSING: Band-Aid  Post procedural instructions including recommending icing and warning signs for infection were reviewed.    This procedure was well tolerated and there were no complications.   IMPRESSION: Succesful Ultrasound Guided: Injection  No follow-ups on file.        Andrena Mews, DO    [place] Physician

## 2018-07-20 NOTE — Patient Instructions (Signed)

## 2018-07-21 ENCOUNTER — Ambulatory Visit: Payer: No Typology Code available for payment source | Admitting: Sports Medicine

## 2018-10-21 ENCOUNTER — Ambulatory Visit (INDEPENDENT_AMBULATORY_CARE_PROVIDER_SITE_OTHER): Payer: No Typology Code available for payment source | Admitting: Family Medicine

## 2018-10-21 ENCOUNTER — Other Ambulatory Visit: Payer: Self-pay

## 2018-10-21 DIAGNOSIS — M170 Bilateral primary osteoarthritis of knee: Secondary | ICD-10-CM | POA: Diagnosis not present

## 2018-10-21 NOTE — Patient Instructions (Signed)
Good to see you.  Ice 20 minutes 2 times daily. Usually after activity and before bed. Turmeric 500mg daily  Tart cherry extract 1200mg at night Vitamin D 2000 IU daily  See me again in 4 weeks  

## 2018-10-21 NOTE — Progress Notes (Signed)
[name] [facility] Medicine [place],  [address] Phone: [phone] Subjective:     CC: bilateral knee pain   I, [name], LAT, ATC, am serving as scribe for Dr. Hulan Saas.  [id number]    07/20/18 per Dr. Nicolasa Ducking note: Pt reports no significant changes in their general health or in their day to day symptoms for the condition(s) that are being treated today.  She continues to have bilateral knee pain left worse than right.  She responded well but had short-term intermittent relief with the Depo-Medrol injections.  She is interested in [place] today.  10/21/18: Julia [name] is a 43 y.o. female coming in with complaint of B knee pain.  She was last seen on 07/20/18 by Dr. Paulla Fore and had B Zilretta injections in her knees.  She states that her knee pain has flared back up w/ L>R.  Zilretta injections helped x 2 months.  She describes the L knee as tight and painful.  She reports swelling in her L knee she believes.  She denies any mechanical symptoms in her knees.  She reports taking IBU daily.  She tried Pennsaid previously which did not help her symptoms.  Pt states that she is no longer doing her HEP because exercises caused more pain.      No past medical history on file. No past surgical history on file. Social History   Socioeconomic History  . Marital status: Married    Spouse name: Not on file  . Number of children: Not on file  . Years of education: Not on file  . Highest education level: Not on file  Occupational History  . Not on file  Social Needs  . Financial resource strain: Not on file  . Food insecurity    Worry: Not on file    Inability: Not on file  . Transportation needs    Medical: Not on file    Non-medical: Not on file  Tobacco Use  . Smoking status: Never Smoker  . Smokeless tobacco: Never Used  Substance and Sexual Activity  . Alcohol use: Never    Frequency: Never  . Drug use: Never  . Sexual activity:  Not on file  Lifestyle  . Physical activity    Days per week: Not on file    Minutes per session: Not on file  . Stress: Not on file  Relationships  . Social Herbalist on phone: Not on file    Gets together: Not on file    Attends religious service: Not on file    Active member of club or organization: Not on file    Attends meetings of clubs or organizations: Not on file    Relationship status: Not on file  Other Topics Concern  . Not on file  Social History Narrative  . Not on file   No Known Allergies No family history on file.     Current Outpatient Medications (Analgesics):  .  ibuprofen (ADVIL,MOTRIN) 400 MG tablet, Take 400 mg by mouth as needed.      Past medical history, social, surgical and family history all reviewed in electronic medical record.  No pertanent information unless stated regarding to the chief complaint.   Review of Systems:  No headache, visual changes, nausea, vomiting, diarrhea, constipation, dizziness, abdominal pain, skin rash, fevers, chills, night sweats, weight loss, swollen lymph nodes, body aches, joint swelling, muscle aches, chest pain, shortness of breath, mood changes.  Objective  Blood pressure 120/80, pulse 91, height 5\' 5"  (1.651 m), weight 250 lb (113.4 kg), SpO2 97 %, unknown if currently breastfeeding.   General: No apparent distress alert and oriented x3 mood and affect normal, dressed appropriately.  HEENT: Pupils equal, extraocular movements intact  Respiratory: Patient's speak in full sentences and does not appear short of breath  Cardiovascular: Trace lower extremity edema, non tender, no erythema  Skin: Warm dry intact with no signs of infection or rash on extremities or on axial skeleton.  Abdomen: Soft nontender  Neuro: Cranial nerves II through XII are intact, neurovascularly intact in all extremities with 2+ DTRs and 2+ pulses.  Lymph: No lymphadenopathy of posterior or anterior cervical chain or  axillae bilaterally.  Gait antalgic MSK:  Non tender with full range of motion and good stability and symmetric strength and tone of shoulders, elbows, wrist, hip, and ankles bilaterally.  Mild poor range of motion of multiple joints  Knee: Bilateral valgus deformity noted. Large thigh to calf ratio.  Tender to palpation over medial and PF joint line.  ROM full in flexion and extension and lower leg rotation. instability with valgus force.  painful patellar compression. Patellar glide with moderate crepitus. Patellar and quadriceps tendons unremarkable. Hamstring and quadriceps strength is normal.  After informed written and verbal consent, patient was seated on exam table. Right knee was prepped with alcohol swab and utilizing anterolateral approach, patient's right knee space was injected with 4:1  marcaine 0.5%: Kenalog 40mg /dL. Patient tolerated the procedure well without immediate complications.  After informed written and verbal consent, patient was seated on exam table. Left knee was prepped with alcohol swab and utilizing anterolateral approach, patient's left knee space was injected with 4:1  marcaine 0.5%: Kenalog 40mg /dL. Patient tolerated the procedure well without immediate complications.   Impression and Recommendations:     This case required medical decision making of moderate complexity. The above documentation has been reviewed and is accurate and complete Judi SaaZachary M Smith, DO       Note: This dictation was prepared with Dragon dictation along with smaller phrase technology. Any transcriptional errors that result from this process are unintentional.

## 2018-10-22 ENCOUNTER — Encounter: Payer: Self-pay | Admitting: Family Medicine

## 2018-10-22 DIAGNOSIS — M170 Bilateral primary osteoarthritis of knee: Secondary | ICD-10-CM | POA: Insufficient documentation

## 2018-10-22 NOTE — Assessment & Plan Note (Signed)
Bilateral injections given today.  Tolerated the procedure well.  Discussed icing regimen and home exercise.  Discussed which activities to do which wants to avoid.  Patient is to increase activity slowly over the course the next several days.  Could be a candidate for Visco supplementation.  Due to patient's age not a surgical candidate.  Do not feel that advanced imaging would change management at this time.  Spent  25 minutes with patient face-to-face and had greater than 50% of counseling including as described above in assessment and plan.

## 2018-11-01 ENCOUNTER — Ambulatory Visit: Payer: No Typology Code available for payment source | Admitting: Family Medicine

## 2018-11-18 ENCOUNTER — Other Ambulatory Visit: Payer: Self-pay

## 2018-11-18 ENCOUNTER — Encounter: Payer: Self-pay | Admitting: Family Medicine

## 2018-11-18 ENCOUNTER — Ambulatory Visit (INDEPENDENT_AMBULATORY_CARE_PROVIDER_SITE_OTHER): Payer: No Typology Code available for payment source | Admitting: Family Medicine

## 2018-11-18 DIAGNOSIS — M170 Bilateral primary osteoarthritis of knee: Secondary | ICD-10-CM

## 2018-11-18 NOTE — Progress Notes (Signed)
[name] [facility] [place], [place] [address] Phone: [phone] Subjective:   I Julia [name] am serving as a Education administrator for Dr. Hulan Saas.   CC: bilateral knee pain   [id number]   10/21/2018 Bilateral injections given today.  Tolerated the procedure well.  Discussed icing regimen and home exercise.  Discussed which activities to do which wants to avoid.  Patient is to increase activity slowly over the course the next several days.  Could be a candidate for Visco supplementation.  Due to patient's age not a surgical candidate.  Do not feel that advanced imaging would change management at this time.  Spent  25 minutes with patient face-to-face and had greater than 50% of counseling including as described above in assessment and plan.  11/18/2018 Julia [name] is a 43 y.o. female coming in with complaint of knee pain. States the last injection did not help at all. Believes vitamins have helped.  Patient states that she feels about 20 to 30% better.  Does not feel that the steroid injections made a significant improvement.  Patient rates the severity of pain still as 6 out of 10.  Still unable to workout secondary to pain no.      No past medical history on file. No past surgical history on file. Social History   Socioeconomic History  . Marital status: Married    Spouse name: Not on file  . Number of children: Not on file  . Years of education: Not on file  . Highest education level: Not on file  Occupational History  . Not on file  Social Needs  . Financial resource strain: Not on file  . Food insecurity    Worry: Not on file    Inability: Not on file  . Transportation needs    Medical: Not on file    Non-medical: Not on file  Tobacco Use  . Smoking status: Never Smoker  . Smokeless tobacco: Never Used  Substance and Sexual Activity  . Alcohol use: Never    Frequency: Never  . Drug use: Never  . Sexual activity: Not on file   Lifestyle  . Physical activity    Days per week: Not on file    Minutes per session: Not on file  . Stress: Not on file  Relationships  . Social Herbalist on phone: Not on file    Gets together: Not on file    Attends religious service: Not on file    Active member of club or organization: Not on file    Attends meetings of clubs or organizations: Not on file    Relationship status: Not on file  Other Topics Concern  . Not on file  Social History Narrative  . Not on file   No Known Allergies No family history on file.     Current Outpatient Medications (Analgesics):  .  ibuprofen (ADVIL,MOTRIN) 400 MG tablet, Take 400 mg by mouth as needed.      Past medical history, social, surgical and family history all reviewed in electronic medical record.  No pertanent information unless stated regarding to the chief complaint.   Review of Systems:  No headache, visual changes, nausea, vomiting, diarrhea, constipation, dizziness, abdominal pain, skin rash, fevers, chills, night sweats, weight loss, swollen lymph nodes, body aches, joint swelling, muscle aches, chest pain, shortness of breath, mood changes.   Objective  unknown if currently breastfeeding. Systems examined below as of  General: No apparent distress alert and oriented x3 mood and affect normal, dressed appropriately.  HEENT: Pupils equal, extraocular movements intact  Respiratory: Patient's speak in full sentences and does not appear short of breath  Cardiovascular: No lower extremity edema, non tender, no erythema  Skin: Warm dry intact with no signs of infection or rash on extremities or on axial skeleton.  Abdomen: Soft nontender  Neuro: Cranial nerves II through XII are intact, neurovascularly intact in all extremities with 2+ DTRs and 2+ pulses.  Lymph: No lymphadenopathy of posterior or anterior cervical chain or axillae bilaterally.  Gait  Antalgic  MSK:  Non tender with full range of motion  and good stability and symmetric strength and tone of shoulders, elbows, wrist, hip and ankles bilaterally.  Knee: Bilateral valgus deformity noted. Large thigh to calf ratio.  Tender to palpation over medial and PF joint line.  ROM full in flexion and extension and lower leg rotation. instability with valgus force.  painful patellar compression. Patellar glide with moderate crepitus. Patellar and quadriceps tendons unremarkable. Hamstring and quadriceps strength is normal.     Impression and Recommendations:      The above documentation has been reviewed and is accurate and complete Julia SaaZachary M Taite Schoeppner, DO       Note: This dictation was prepared with Dragon dictation along with smaller phrase technology. Any transcriptional errors that result from this process are unintentional.

## 2018-11-18 NOTE — Assessment & Plan Note (Signed)
Patient did not respond well to the steroid injection.  Awaiting for approval for the Visco supplementation within pain And her failing all conservative therapy.  Encourage patient to consider the nonweightbearing exercises as much as possible and encourage weight loss.  Patient is very motivated to lose weight but is wanting to do more walking and is unable to do so until her knees feel better.  We will call patient to reschedule when we do have approval.  Continue vitamin supplementation and exercises.

## 2018-11-29 NOTE — Progress Notes (Signed)
[name] [address] [place], [place] [address] Phone: [phone] Subjective:   Julia Julia [name], am serving as a scribe for Dr. Hulan Saas.   CC:  Bilateral knee pain   ONG:[id number]   11/18/2018 Patient did not respond well to the steroid injection.  Awaiting for approval for the Visco supplementation within pain And her failing all conservative therapy.  Encourage patient to consider the nonweightbearing exercises as much as possible and encourage weight loss.  Patient is very motivated to lose weight but is wanting to do more walking and is unable to do so until her knees feel better.  We will call patient to reschedule when we do have approval.  Continue vitamin supplementation and exercises.  Update 11/30/2018 Julia Julia [name] is a 43 y.o. female coming in with complaint of  Bilateral knee pain.  Known arthritic changes bilaterally.  Failed all conservative therapy including steroid injections.     Julia [name] past medical history on file. Julia [name] past surgical history on file. Social History   Socioeconomic History  . Marital status: Married    Spouse name: Not on file  . Number of children: Not on file  . Years of education: Not on file  . Highest education level: Not on file  Occupational History  . Not on file  Social Needs  . Financial resource strain: Not on file  . Food insecurity    Worry: Not on file    Inability: Not on file  . Transportation needs    Medical: Not on file    Non-medical: Not on file  Tobacco Use  . Smoking status: Never Smoker  . Smokeless tobacco: Never Used  Substance and Sexual Activity  . Alcohol use: Never    Frequency: Never  . Drug use: Never  . Sexual activity: Not on file  Lifestyle  . Physical activity    Days per week: Not on file    Minutes per session: Not on file  . Stress: Not on file  Relationships  . Social Herbalist on phone: Not on file    Gets together: Not on file    Attends  religious service: Not on file    Active member of club or organization: Not on file    Attends meetings of clubs or organizations: Not on file    Relationship status: Not on file  Other Topics Concern  . Not on file  Social History Narrative  . Not on file   Julia [name] Known Allergies Julia [name] known drug allergies Julia [name] family history on file.  Julia [name] family history of autoimmune     Current Outpatient Medications (Analgesics):  .  ibuprofen (ADVIL,MOTRIN) 400 MG tablet, Take 400 mg by mouth as needed.      Past medical history, social, surgical and family history all reviewed in electronic medical record.  Julia [name] pertanent information unless stated regarding to the chief complaint.   Review of Systems:  Julia [name] headache, visual changes, nausea, vomiting, diarrhea, constipation, dizziness, abdominal pain, skin rash, fevers, chills, night sweats, weight loss, swollen lymph nodes, , chest pain, shortness of breath, mood changes.  Positive muscle aches and joint swelling  Objective  Blood pressure 108/64, pulse (!) 109, height 5\' 5"  (1.651 m), weight 256 lb (116.1 kg), SpO2 99 %, unknown if currently breastfeeding.    General: Julia [name] apparent distress alert and oriented x3 mood and affect normal, dressed appropriately.  HEENT: Pupils equal, extraocular movements intact  Respiratory: Patient's  speak in full sentences and does not appear short of breath  Cardiovascular: Julia [name] lower extremity edema, non tender, Julia [name] erythema  Skin: Warm dry intact with Julia [name] signs of infection or rash on extremities or on axial skeleton.  Abdomen: Soft nontender  Neuro: Cranial nerves II through XII are intact, neurovascularly intact in all extremities with 2+ DTRs and 2+ pulses.  Lymph: Julia [name] lymphadenopathy of posterior or anterior cervical chain or axillae bilaterally.  Gait mild antalgic gait MSK:  Non tender with full range of motion and good stability and symmetric strength and tone of shoulders, elbows, wrist, hip, and ankles  bilaterally.  Knee: Bilateral valgus deformity noted. Large thigh to calf ratio.  Tender to palpation over medial and PF joint line.  ROM full in flexion and extension and lower leg rotation. instability with valgus force.  painful patellar compression. Patellar glide with moderate crepitus. Patellar and quadriceps tendons unremarkable. Hamstring and quadriceps strength is normal.   After informed written and verbal consent, patient was seated on exam table. Right knee was prepped with alcohol swab and utilizing anterolateral approach, patient's right knee space was injected with 22 mg/mL of Monovisc (sodium hyaluronate) in a prefilled syringe was injected easily into the knee through a 22-gauge needle..Patient tolerated the procedure well without immediate complications.  After informed written and verbal consent, patient was seated on exam table. Left knee was prepped with alcohol swab and utilizing anterolateral approach, patient's left knee space was injected with 22 mg/mL of Monovisc (sodium hyaluronate) in a prefilled syringe was injected easily into the knee through a 22-gauge needle..Patient tolerated the procedure well without immediate complications.   Impression and Recommendations:     This case required medical decision making of moderate complexity. The above documentation has been reviewed and is accurate and complete Judi SaaZachary M Luverne Zerkle, DO       Note: This dictation was prepared with Dragon dictation along with smaller phrase technology. Any transcriptional errors that result from this process are unintentional.

## 2018-11-30 ENCOUNTER — Ambulatory Visit (INDEPENDENT_AMBULATORY_CARE_PROVIDER_SITE_OTHER): Payer: No Typology Code available for payment source | Admitting: Family Medicine

## 2018-11-30 ENCOUNTER — Encounter: Payer: Self-pay | Admitting: Family Medicine

## 2018-11-30 ENCOUNTER — Other Ambulatory Visit: Payer: Self-pay

## 2018-11-30 DIAGNOSIS — M170 Bilateral primary osteoarthritis of knee: Secondary | ICD-10-CM | POA: Diagnosis not present

## 2018-11-30 NOTE — Assessment & Plan Note (Signed)
Patient given viscosupplementation today.  Warned that it may take him until patient sees significant improvement.  Hopefully the patient will do well with this and continue all other conservative therapy.  Follow-up with me again in 4 to 8 weeks

## 2018-11-30 NOTE — Patient Instructions (Signed)
See me in 6 weeks Visco in both knees today

## 2019-01-10 NOTE — Progress Notes (Signed)
[name] [place], [place] [address] Phone: [phone] Subjective:   Julia [name], am serving as a scribe for Dr. Hulan [name].    CC: Knee pain follow-up  UMP:[id number]   11/30/2018 Patient given viscosupplementation today.  Warned that it may take him until patient sees significant improvement.  Hopefully the patient will do well with this and continue all other conservative therapy.  Follow-up with me again in 4 to 8 weeks  Update 01/12/2019 Julia [name] is a 43 y.o. female coming in with complaint of left knee pain. Patient has had some improvement. Does take IBU at night. Wakes up in morning with less pain. Does have increase in pain throughout the day on the medial aspect of knee.   Viscosupplementation given November 30, 2018  [name] past medical history on file. [name] past surgical history on file. Social History   Socioeconomic History  . Marital status: Married    Spouse name: Not on file  . Number of children: Not on file  . Years of education: Not on file  . Highest education level: Not on file  Occupational History  . Not on file  Social Needs  . Financial resource strain: Not on file  . Food insecurity    Worry: Not on file    Inability: Not on file  . Transportation needs    Medical: Not on file    Non-medical: Not on file  Tobacco Use  . Smoking status: Never Smoker  . Smokeless tobacco: Never Used  Substance and Sexual Activity  . Alcohol use: Never    Frequency: Never  . Drug use: Never  . Sexual activity: Not on file  Lifestyle  . Physical activity    Days per week: Not on file    Minutes per session: Not on file  . Stress: Not on file  Relationships  . Social Herbalist on phone: Not on file    Gets together: Not on file    Attends religious service: Not on file    Active member of club or organization: Not on file    Attends meetings of clubs or organizations: Not on file   Relationship status: Not on file  Other Topics Concern  . Not on file  Social History Narrative  . Not on file   [name] Known Allergies [name] family history on file.     Current Outpatient Medications (Analgesics):  .  ibuprofen (ADVIL,MOTRIN) 400 MG tablet, Take 400 mg by mouth as needed.      Past medical history, social, surgical and family history all reviewed in electronic medical record.  [name] pertanent information unless stated regarding to the chief complaint.   Review of Systems:  [name] headache, visual changes, nausea, vomiting, diarrhea, constipation, dizziness, abdominal pain, skin rash, fevers, chills, night sweats, weight loss, swollen lymph nodes, body aches, joint swelling, muscle aches, chest pain, shortness of breath, mood changes.   Objective  Blood pressure 118/86, pulse 89, height 5\' 5"  (1.651 m), weight 254 lb (115.2 kg), SpO2 99 %, unknown if currently breastfeeding.    General: [name] apparent distress alert and oriented x3 mood and affect normal, dressed appropriately.  HEENT: Pupils equal, extraocular movements intact  Respiratory: Patient's speak in full sentences and does not appear short of breath  Cardiovascular: [name] lower extremity edema, non tender, [name] erythema  Skin: Warm dry intact with [name] signs of infection or rash on extremities or on axial  skeleton.  Abdomen: Soft nontender  Neuro: Cranial nerves II through XII are intact, neurovascularly intact in all extremities with 2+ DTRs and 2+ pulses.  Lymph: [name] lymphadenopathy of posterior or anterior cervical chain or axillae bilaterally.  Gait normal with good balance and coordination.  MSK:  Non tender with full range of motion and good stability and symmetric strength and tone of shoulders, elbows, wrist, hip, knee and ankles bilaterally.  Knee: Bilateral valgus deformity noted.  Abnormal thigh to calf ratio.  Minimal tenderness ROM full in flexion and extension and lower leg rotation. instability with valgus  force.  painful patellar compression but improvement. Patellar glide with mild crepitus. Patellar and quadriceps tendons unremarkable. Hamstring and quadriceps strength is normal.      Impression and Recommendations:      The above documentation has been reviewed and is accurate and complete Judi Saa, DO       Note: This dictation was prepared with Dragon dictation along with smaller phrase technology. Any transcriptional errors that result from this process are unintentional.

## 2019-01-12 ENCOUNTER — Encounter: Payer: Self-pay | Admitting: Family Medicine

## 2019-01-12 ENCOUNTER — Ambulatory Visit (INDEPENDENT_AMBULATORY_CARE_PROVIDER_SITE_OTHER): Payer: No Typology Code available for payment source | Admitting: Family Medicine

## 2019-01-12 ENCOUNTER — Other Ambulatory Visit: Payer: Self-pay

## 2019-01-12 DIAGNOSIS — M170 Bilateral primary osteoarthritis of knee: Secondary | ICD-10-CM | POA: Diagnosis not present

## 2019-01-12 NOTE — Patient Instructions (Addendum)
Duexis as needed Let us know if you like it and we will order prescription

## 2019-01-12 NOTE — Assessment & Plan Note (Signed)
Patient is improving at this time.  I expect that patient did relatively well for a little while.  Discussed which activities to do which will still avoid.  Follow-up again as needed as long as the knee is doing well

## 2021-01-21 IMAGING — DX RIGHT KNEE - 1-2 VIEW
2 series · 2 of 2 positions shown · non-contrast
Comparison: None.

CLINICAL DATA: Right knee pain for 1 year

EXAM:
RIGHT KNEE - 2 VIEW

[knee standing lat]
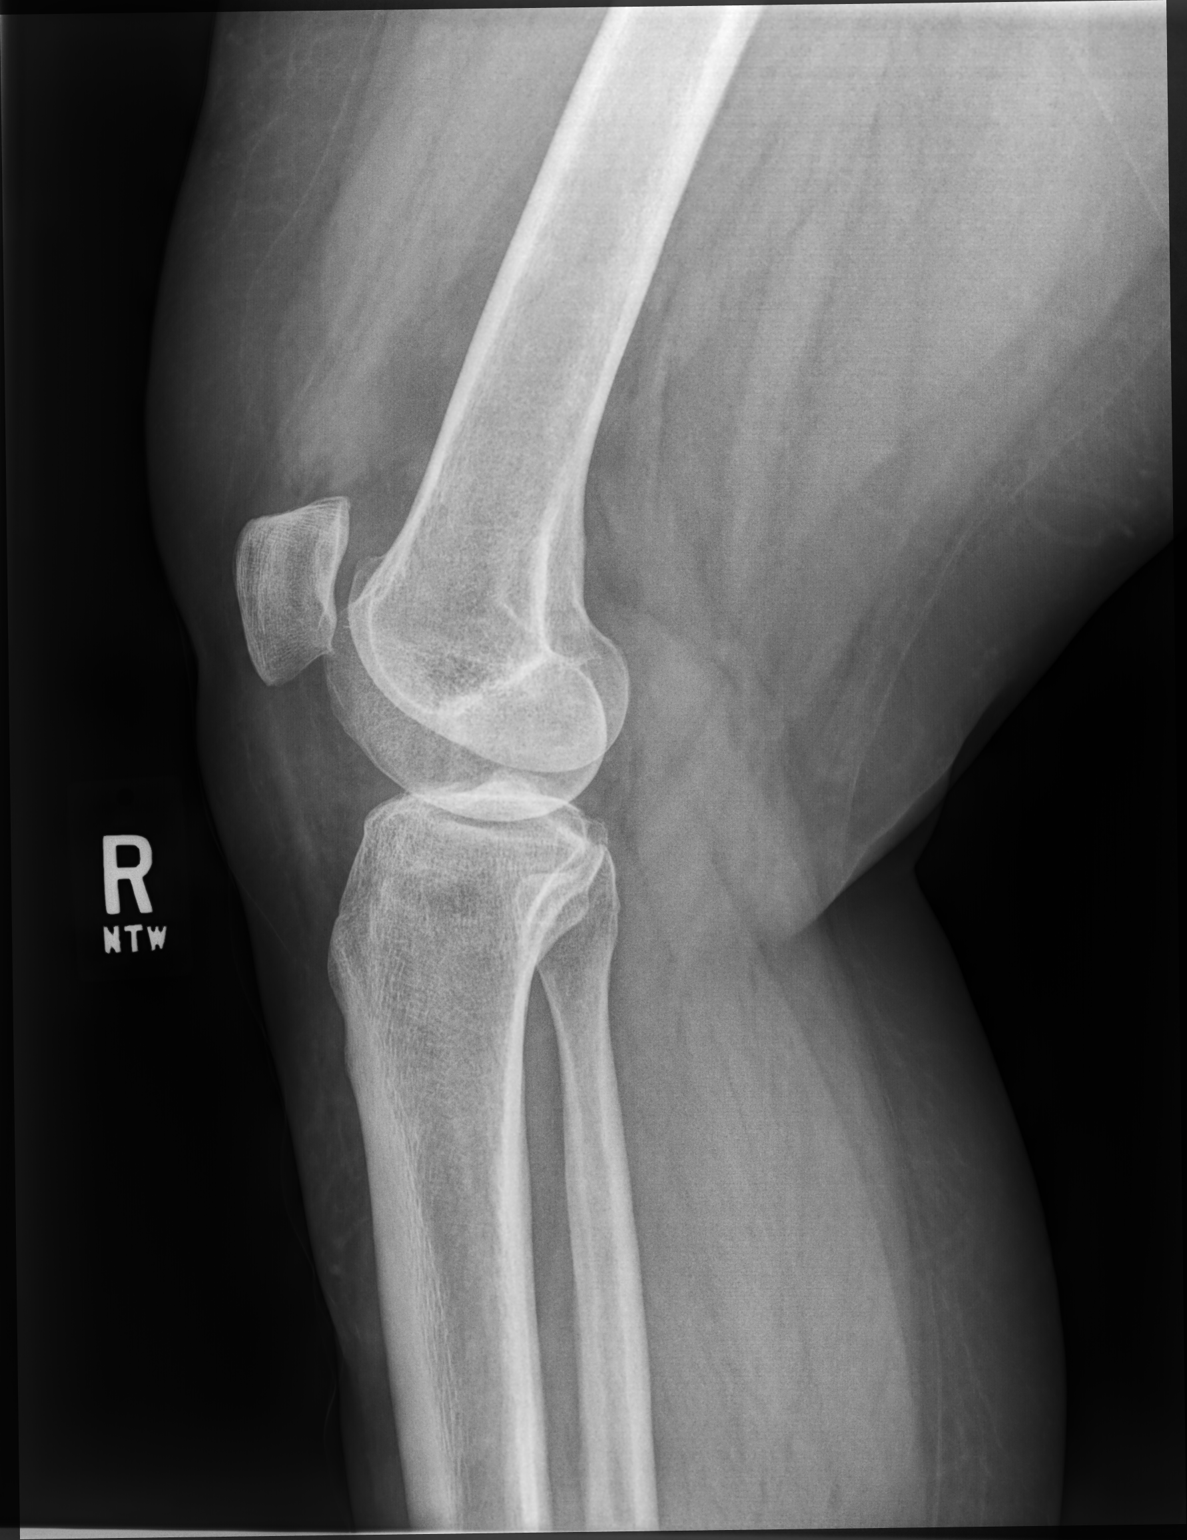

[sunrise]
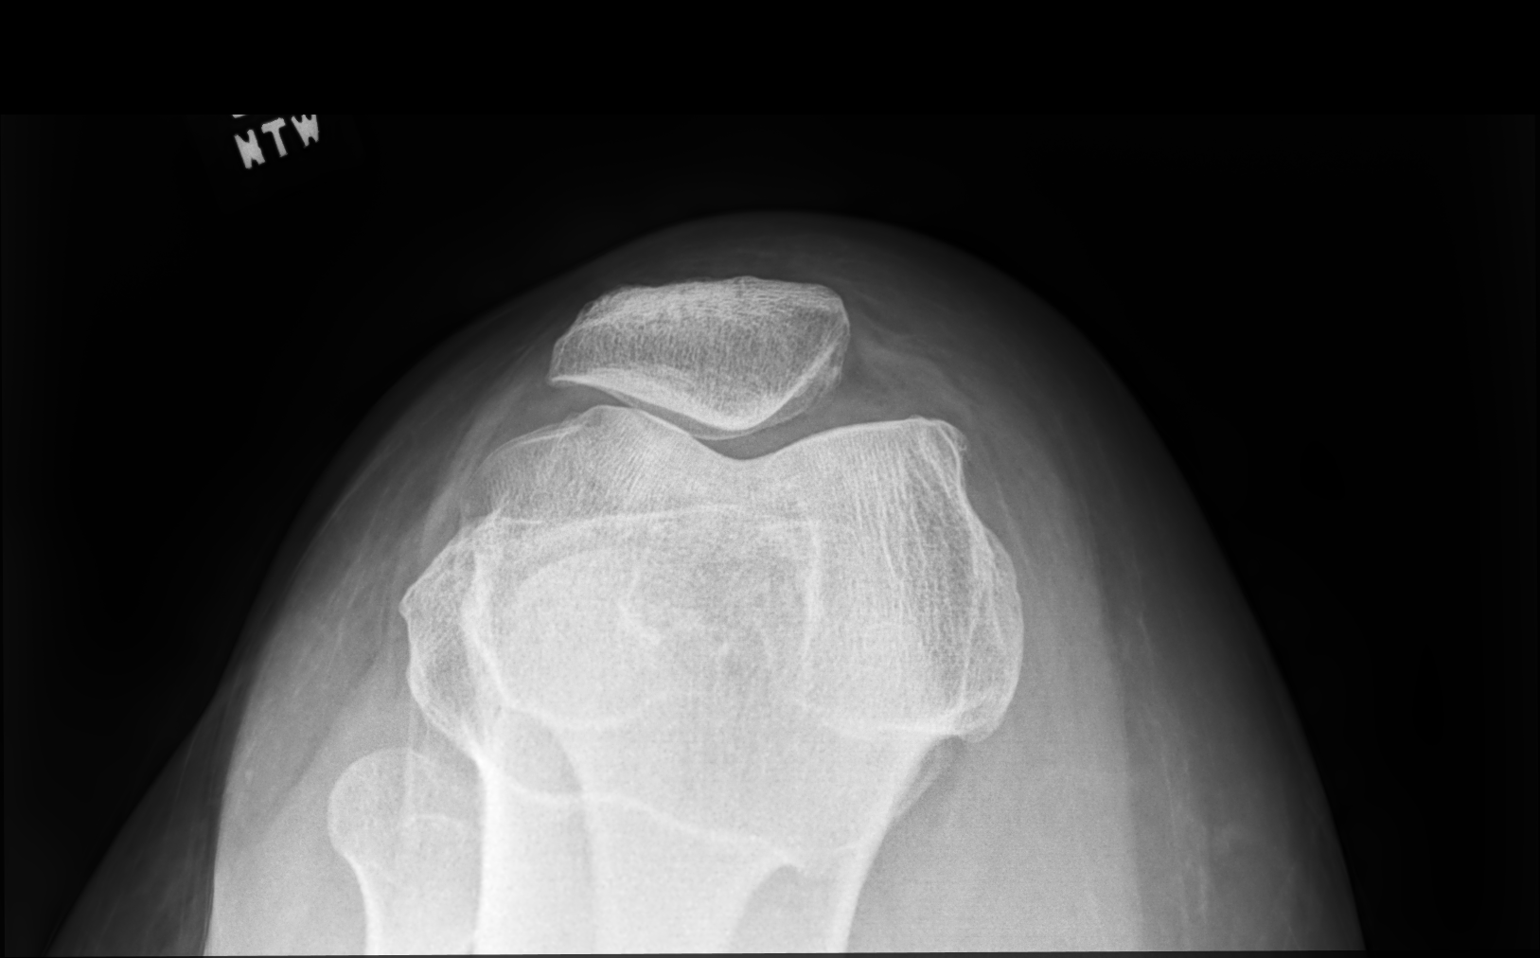

[2 of 2 positions shown; findings below may reference images not displayed]

FINDINGS: Mild patellofemoral and medial degenerative changes are noted. No
acute fracture or dislocation is seen. No joint effusion is noted.
IMPRESSION: Degenerative change without acute abnormality.

## 2024-01-05 ENCOUNTER — Ambulatory Visit: Admitting: Family
